# Patient Record
Sex: Male | Born: 1990 | Race: Black or African American | Hispanic: No | Marital: Single | Smoking: Current every day smoker
Health system: Southern US, Community
[De-identification: ages and names within clinical notes are randomized; demographics above are authoritative.]

---

## 2019-01-05 ENCOUNTER — Other Ambulatory Visit: Payer: Self-pay

## 2019-01-05 ENCOUNTER — Ambulatory Visit (HOSPITAL_COMMUNITY)
Admission: EM | Admit: 2019-01-05 | Discharge: 2019-01-05 | Disposition: A | Discharge status: Home / Self Care | Payer: BLUE CROSS/BLUE SHIELD | Attending: Internal Medicine | Admitting: Internal Medicine

## 2019-01-05 ENCOUNTER — Encounter (HOSPITAL_COMMUNITY): Payer: Self-pay | Admitting: Emergency Medicine

## 2019-01-05 DIAGNOSIS — J111 Influenza due to unidentified influenza virus with other respiratory manifestations: Secondary | ICD-10-CM

## 2019-01-05 DIAGNOSIS — R69 Illness, unspecified: Secondary | ICD-10-CM

## 2019-01-05 DIAGNOSIS — R0689 Other abnormalities of breathing: Secondary | ICD-10-CM

## 2019-01-05 MED ORDER — ONDANSETRON 4 MG PO TBDP
ORAL_TABLET | ORAL | Status: AC
  Filled 2019-01-05: qty 1

## 2019-01-05 MED ORDER — ONDANSETRON HCL 4 MG PO TABS: 4.0000 mg | ORAL_TABLET | Freq: Four times a day (QID) | ORAL | 0 refills | Status: AC

## 2019-01-05 MED ORDER — OSELTAMIVIR PHOSPHATE 75 MG PO CAPS: 75.0000 mg | ORAL_CAPSULE | Freq: Two times a day (BID) | ORAL | 0 refills | Status: AC

## 2019-01-05 MED ORDER — ONDANSETRON 4 MG PO TBDP
4.0000 mg | ORAL_TABLET | Freq: Once | ORAL | Status: AC
  Administered 2019-01-05: 4 mg via ORAL

## 2019-01-05 MED ORDER — ACETAMINOPHEN 325 MG PO TABS
ORAL_TABLET | ORAL | Status: AC
  Filled 2019-01-05: qty 2

## 2019-01-05 MED ORDER — ACETAMINOPHEN 325 MG PO TABS
650.0000 mg | ORAL_TABLET | Freq: Once | ORAL | Status: AC
  Administered 2019-01-05: 650 mg via ORAL

## 2019-01-05 MED ORDER — IPRATROPIUM-ALBUTEROL 0.5-2.5 (3) MG/3ML IN SOLN
RESPIRATORY_TRACT | Status: AC
  Filled 2019-01-05: qty 3

## 2019-01-05 MED ORDER — IPRATROPIUM-ALBUTEROL 0.5-2.5 (3) MG/3ML IN SOLN
3.0000 mL | Freq: Once | RESPIRATORY_TRACT | Status: AC
  Administered 2019-01-05: 3 mL via RESPIRATORY_TRACT

## 2019-01-05 MED ORDER — BENZONATATE 200 MG PO CAPS: 200.0000 mg | ORAL_CAPSULE | Freq: Three times a day (TID) | ORAL | 0 refills | Status: AC | PRN

## 2019-01-05 NOTE — Discharge Instructions (Signed)
Symptoms today seem most consistent with flu like illness.  Push fluids and rest.  Anticipate gradual improvement in cough, wellbeing, fever, over the next several days.  Cough may take a couple weeks to subside.  Prescriptions for oseltamivir (flu medicine), benzonatate (for cough), ondansetron (for vomiting) were sent to the pharmacy.  Tylenol or ibuprofen otc as needed for fever, discomfort.  Recheck for persistent (>3 more days) fever >100.5, increasing phlegm production/nasal discharge, or if not starting to improve in a few days.

## 2019-01-05 NOTE — ED Provider Notes (Signed)
[facility]    CSN: [id number] Arrival date & time: 01/05/19  1810     History   Chief Complaint Chief Complaint  Patient presents with  . Fever  . Emesis    HPI Herbert Nixon is a 28 y.o. male. He presents today with onset of fever, cough, prostration on 2/5; had emesis (post tussive and non post tussive) yesterday, decreasing today.  Feels terrible.  Little bit of runny/congested nose.  Chest tightness, hurts to cough.  No diarrhea.  Headache    HPI  History reviewed. No pertinent past medical history.  History reviewed. No pertinent surgical history.     Home Medications    Prior to Admission medications   Medication Sig Start Date End Date Taking? Authorizing Provider  benzonatate (TESSALON) 200 MG capsule Take 1 capsule (200 mg total) by mouth 3 (three) times daily as needed for cough. 01/05/19   Isa RankinMurray, Anajulia Leyendecker Wilson, MD  ondansetron (ZOFRAN) 4 MG tablet Take 1 tablet (4 mg total) by mouth every 6 (six) hours. 01/05/19   Isa RankinMurray, Maysin Carstens Wilson, MD  oseltamivir (TAMIFLU) 75 MG capsule Take 1 capsule (75 mg total) by mouth every 12 (twelve) hours. 01/05/19   Isa RankinMurray, Karma Ansley Wilson, MD    Family History History reviewed. No pertinent family history.  Social History Social History   Tobacco Use  . Smoking status: Current Every Day Smoker    Packs/day: 1.00    Years: 5.00    Pack years: 5.00    Types: Cigarettes  . Smokeless tobacco: Never Used  Substance Use Topics  . Alcohol use: Yes  . Drug use: Never     Allergies   Patient has no known allergies.   Review of Systems Review of Systems  All other systems reviewed and are negative.    Physical Exam Triage Vital Signs ED Triage Vitals  Enc Vitals Group     BP 01/05/19 1832 124/78     Pulse Rate 01/05/19 1832 73     Resp 01/05/19 1832 16     Temp 01/05/19 1832 (!) 100.6 F (38.1 C)     Temp Source 01/05/19 1832 Temporal     SpO2 01/05/19 1832 100 %     Weight --      Height --        Pain Score 01/05/19 1830 8     Pain Loc --    Updated Vital Signs BP 124/78 (BP Location: Right Arm)   Pulse 73   Temp (!) 100.6 F (38.1 C) (Temporal)   Resp 16   SpO2 100%  Physical Exam Vitals signs and nursing note reviewed.  Constitutional:      Comments: Alert, nicely groomed Looks ill but not toxic Laying down on exam table but able to sit up for exam  HENT:     Head: Atraumatic.     Comments: B TMs translucent, no erythema Mild nasal congestion bilaterally, mucusy material present Throat red Eyes:     Comments: Conjugate gaze, no eye redness/drainage  Neck:     Musculoskeletal: Neck supple.  Cardiovascular:     Rate and Rhythm: Normal rate and regular rhythm.  Pulmonary:     Effort: No respiratory distress.     Breath sounds: No wheezing or rales.     Comments: Coarse but symmetric breath sounds throughout Abdominal:     General: There is no distension.     Palpations: Abdomen is soft.     Tenderness: There is no abdominal tenderness.  There is no guarding.  Musculoskeletal: Normal range of motion.  Skin:    General: Skin is warm and dry.     Comments: No cyanosis  Neurological:     Mental Status: He is alert and oriented to person, place, and time.      UC Treatments / Results   Procedures Procedures (including critical care time)  Medications Ordered in UC Medications  ondansetron (ZOFRAN-ODT) disintegrating tablet 4 mg (4 mg Oral Given 01/05/19 1840)  acetaminophen (TYLENOL) tablet 650 mg (650 mg Oral Given 01/05/19 1840)  ipratropium-albuterol (DUONEB) 0.5-2.5 (3) MG/3ML nebulizer solution 3 mL (3 mLs Nebulization Given 01/05/19 1852)   Patient did not feel significant change in chest tightness after breathing treatment at urgent care.   Final Clinical Impressions(s) / UC Diagnoses   Final diagnoses:  Influenza-like illness     Discharge Instructions     Symptoms today seem most consistent with flu like illness.  Push fluids and rest.   Anticipate gradual improvement in cough, wellbeing, fever, over the next several days.  Cough may take a couple weeks to subside.  Prescriptions for oseltamivir (flu medicine), benzonatate (for cough), ondansetron (for vomiting) were sent to the pharmacy.  Tylenol or ibuprofen otc as needed for fever, discomfort.  Recheck for persistent (>3 more days) fever >100.5, increasing phlegm production/nasal discharge, or if not starting to improve in a few days.       ED Prescriptions    Medication Sig Dispense Auth. Provider   oseltamivir (TAMIFLU) 75 MG capsule Take 1 capsule (75 mg total) by mouth every 12 (twelve) hours. 10 capsule Isa RankinMurray, Lessie Manigo Wilson, MD   benzonatate (TESSALON) 200 MG capsule Take 1 capsule (200 mg total) by mouth 3 (three) times daily as needed for cough. 30 capsule Isa RankinMurray, Teria Khachatryan Wilson, MD   ondansetron (ZOFRAN) 4 MG tablet Take 1 tablet (4 mg total) by mouth every 6 (six) hours. 12 tablet Isa RankinMurray, Caralee Morea Wilson, MD        Isa RankinMurray, Gracey Tolle Wilson, MD 01/06/19 1011

## 2019-01-05 NOTE — ED Triage Notes (Signed)
The patient presented to the [facility] with a complaint of a fever and emesis x 3 days.

## 2019-01-15 ENCOUNTER — Emergency Department (HOSPITAL_COMMUNITY): Payer: BLUE CROSS/BLUE SHIELD

## 2019-01-15 ENCOUNTER — Emergency Department (HOSPITAL_COMMUNITY)
Admission: EM | Admit: 2019-01-15 | Discharge: 2019-01-15 | Disposition: A | Discharge status: Home / Self Care | Payer: BLUE CROSS/BLUE SHIELD | Attending: Emergency Medicine | Admitting: Emergency Medicine

## 2019-01-15 DIAGNOSIS — Y999 Unspecified external cause status: Secondary | ICD-10-CM | POA: Insufficient documentation

## 2019-01-15 DIAGNOSIS — S32029A Unspecified fracture of second lumbar vertebra, initial encounter for closed fracture: Secondary | ICD-10-CM | POA: Diagnosis not present

## 2019-01-15 DIAGNOSIS — Y9389 Activity, other specified: Secondary | ICD-10-CM | POA: Insufficient documentation

## 2019-01-15 DIAGNOSIS — S299XXA Unspecified injury of thorax, initial encounter: Secondary | ICD-10-CM | POA: Diagnosis present

## 2019-01-15 DIAGNOSIS — S301XXA Contusion of abdominal wall, initial encounter: Secondary | ICD-10-CM | POA: Diagnosis not present

## 2019-01-15 DIAGNOSIS — Y92410 Unspecified street and highway as the place of occurrence of the external cause: Secondary | ICD-10-CM | POA: Diagnosis not present

## 2019-01-15 DIAGNOSIS — S2220XA Unspecified fracture of sternum, initial encounter for closed fracture: Secondary | ICD-10-CM

## 2019-01-15 DIAGNOSIS — S0990XA Unspecified injury of head, initial encounter: Secondary | ICD-10-CM | POA: Diagnosis not present

## 2019-01-15 LAB — BASIC METABOLIC PANEL
Anion gap: 9 (ref 5–15)
BUN: 8 mg/dL (ref 6–20)
CHLORIDE: 109 mmol/L (ref 98–111)
CO2: 25 mmol/L (ref 22–32)
CREATININE: 0.92 mg/dL (ref 0.61–1.24)
Calcium: 9.1 mg/dL (ref 8.9–10.3)
GFR calc Af Amer: >60 mL/min (ref >60)
GFR calc non Af Amer: >60 mL/min (ref >60)
Glucose, Bld: 97 mg/dL (ref 70–99)
Potassium: 3.8 mmol/L (ref 3.5–5.1)
Sodium: 143 mmol/L (ref 135–145)

## 2019-01-15 LAB — CBC WITH DIFFERENTIAL/PLATELET
Abs Immature Granulocytes: 0.15 10*3/uL — ABNORMAL HIGH (ref 0.00–0.07)
Basophils Absolute: 0 10*3/uL (ref 0.0–0.1)
Basophils Relative: 0 %
EOS PCT: 1 %
Eosinophils Absolute: 0.1 10*3/uL (ref 0.0–0.5)
HCT: 40.9 % (ref 39.0–52.0)
Hemoglobin: 13.7 g/dL (ref 13.0–17.0)
Immature Granulocytes: 1 %
Lymphocytes Relative: 54 %
Lymphs Abs: 5.6 10*3/uL — ABNORMAL HIGH (ref 0.7–4.0)
MCH: 32.8 pg (ref 26.0–34.0)
MCHC: 33.5 g/dL (ref 30.0–36.0)
MCV: 97.8 fL (ref 80.0–100.0)
MONO ABS: 0.7 10*3/uL (ref 0.1–1.0)
Monocytes Relative: 6 %
Neutro Abs: 4 10*3/uL (ref 1.7–7.7)
Neutrophils Relative %: 38 %
Platelets: 323 10*3/uL (ref 150–400)
RBC: 4.18 MIL/uL — ABNORMAL LOW (ref 4.22–5.81)
RDW: 11.4 % — ABNORMAL LOW (ref 11.5–15.5)
WBC: 10.6 10*3/uL — ABNORMAL HIGH (ref 4.0–10.5)
nRBC: 0 % (ref 0.0–0.2)

## 2019-01-15 LAB — ETHANOL: Alcohol, Ethyl (B): 259 mg/dL — ABNORMAL HIGH (ref <10)

## 2019-01-15 LAB — CBG MONITORING, ED: Glucose-Capillary: 77 mg/dL (ref 70–99)

## 2019-01-15 MED ORDER — HYDROCODONE-ACETAMINOPHEN 5-325 MG PO TABS: 1.0000 | ORAL_TABLET | Freq: Four times a day (QID) | ORAL | 0 refills | Status: DC | PRN

## 2019-01-15 MED ORDER — KETOROLAC TROMETHAMINE 60 MG/2ML IM SOLN
60.0000 mg | Freq: Once | INTRAMUSCULAR | Status: AC
  Administered 2019-01-15: 60 mg via INTRAMUSCULAR
  Filled 2019-01-15: qty 2

## 2019-01-15 MED ORDER — IOHEXOL 300 MG/ML  SOLN
100.0000 mL | Freq: Once | INTRAMUSCULAR | Status: AC | PRN
  Administered 2019-01-15: 100 mL via INTRAVENOUS

## 2019-01-15 NOTE — Discharge Instructions (Addendum)
Hydrocodone as prescribed as needed for pain.  Follow-up with your primary doctor in the next 1 to 2 weeks, and return to the ER if symptoms significantly worsen or change.

## 2019-01-15 NOTE — ED Notes (Signed)
Family at beside. Family given emotional support. 
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
## 2019-01-15 NOTE — ED Notes (Signed)
[employer] at bedside, specimen collected completed by Phlebotomy
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
## 2019-01-15 NOTE — ED Notes (Signed)
Chaplain was able to speak with sister after pt gave consent to call.

## 2019-01-15 NOTE — ED Notes (Signed)
Pt walked to restroom with assistance , while in restroom pt vomited and and became diaphoretic , pt remained alert and was placed back in bed

## 2019-01-15 NOTE — ED Notes (Signed)
Condom cath placed on pt, pt tolerated well

## 2019-01-15 NOTE — ED Notes (Signed)
Cervical collar removed by MD.

## 2019-01-15 NOTE — ED Notes (Signed)
Pt given an IS and showed how to use ,

## 2019-01-15 NOTE — ED Notes (Signed)
MD at bedside explaining POC to family
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
## 2019-01-15 NOTE — ED Notes (Signed)
Vital signs stable. 
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
## 2019-01-15 NOTE — ED Notes (Signed)
Family updated as to patient's status.

## 2019-01-19 ENCOUNTER — Encounter (HOSPITAL_COMMUNITY): Payer: Self-pay | Admitting: Emergency Medicine

## 2019-01-19 ENCOUNTER — Ambulatory Visit (HOSPITAL_COMMUNITY)
Admission: EM | Admit: 2019-01-19 | Discharge: 2019-01-19 | Disposition: A | Discharge status: Home / Self Care | Payer: BLUE CROSS/BLUE SHIELD | Attending: Internal Medicine | Admitting: Internal Medicine

## 2019-01-19 DIAGNOSIS — S32029D Unspecified fracture of second lumbar vertebra, subsequent encounter for fracture with routine healing: Secondary | ICD-10-CM

## 2019-01-19 DIAGNOSIS — S2220XD Unspecified fracture of sternum, subsequent encounter for fracture with routine healing: Secondary | ICD-10-CM | POA: Diagnosis not present

## 2019-01-19 NOTE — ED Notes (Signed)
Patient able to ambulate independently  
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
## 2019-01-22 ENCOUNTER — Encounter: Payer: Self-pay | Admitting: Medical

## 2019-01-22 ENCOUNTER — Ambulatory Visit: Payer: BLUE CROSS/BLUE SHIELD | Admitting: Medical

## 2019-01-22 VITALS — BP 128/74 | HR 75 | Temp 97.9°F | Resp 16 | Ht 69.0 in | Wt 139.2 lb

## 2019-01-22 DIAGNOSIS — S32029A Unspecified fracture of second lumbar vertebra, initial encounter for closed fracture: Secondary | ICD-10-CM | POA: Insufficient documentation

## 2019-01-22 DIAGNOSIS — M549 Dorsalgia, unspecified: Secondary | ICD-10-CM | POA: Insufficient documentation

## 2019-01-22 DIAGNOSIS — Z789 Other specified health status: Secondary | ICD-10-CM | POA: Insufficient documentation

## 2019-01-22 DIAGNOSIS — R0789 Other chest pain: Secondary | ICD-10-CM | POA: Diagnosis not present

## 2019-01-22 DIAGNOSIS — M79641 Pain in right hand: Secondary | ICD-10-CM | POA: Insufficient documentation

## 2019-01-22 DIAGNOSIS — R079 Chest pain, unspecified: Secondary | ICD-10-CM | POA: Diagnosis not present

## 2019-01-22 DIAGNOSIS — M898X1 Other specified disorders of bone, shoulder: Secondary | ICD-10-CM | POA: Insufficient documentation

## 2019-01-22 LAB — POCT URINALYSIS DIP (PROADVANTAGE DEVICE)
Bilirubin, UA: NEGATIVE
Blood, UA: NEGATIVE
Glucose, UA: NEGATIVE mg/dL
Ketones, POC UA: NEGATIVE mg/dL
Leukocytes, UA: NEGATIVE
Nitrite, UA: NEGATIVE
Protein Ur, POC: NEGATIVE mg/dL
Specific Gravity, Urine: 1.01
Urobilinogen, Ur: NEGATIVE
pH, UA: 6 (ref 5.0–8.0)

## 2019-01-22 MED ORDER — HYDROCODONE-ACETAMINOPHEN 5-325 MG PO TABS: 1.0000 | ORAL_TABLET | Freq: Four times a day (QID) | ORAL | 0 refills | Status: AC | PRN

## 2019-01-22 MED ORDER — NAPROXEN 500 MG PO TABS: 500.0000 mg | ORAL_TABLET | Freq: Two times a day (BID) | ORAL | 0 refills | Status: DC
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
## 2019-01-25 ENCOUNTER — Ambulatory Visit
Admission: RE | Admit: 2019-01-25 | Discharge: 2019-01-25 | Disposition: A | Discharge status: Home / Self Care | Payer: BLUE CROSS/BLUE SHIELD | Source: Ambulatory Visit | Attending: Medical | Admitting: Medical

## 2019-01-25 DIAGNOSIS — M79641 Pain in right hand: Secondary | ICD-10-CM

## 2019-01-31 ENCOUNTER — Encounter: Payer: Self-pay | Admitting: Internal Medicine

## 2019-02-13 ENCOUNTER — Inpatient Hospital Stay: Payer: BLUE CROSS/BLUE SHIELD | Admitting: Family Medicine

## 2019-02-27 ENCOUNTER — Telehealth: Payer: Self-pay | Admitting: Medical
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
## 2019-02-28 NOTE — Telephone Encounter (Signed)
Informed pt .

## 2019-03-17 ENCOUNTER — Ambulatory Visit (HOSPITAL_COMMUNITY)
Admission: EM | Admit: 2019-03-17 | Discharge: 2019-03-17 | Disposition: A | Discharge status: Home / Self Care | Payer: BLUE CROSS/BLUE SHIELD | Attending: Family Medicine | Admitting: Family Medicine

## 2019-03-17 ENCOUNTER — Encounter (HOSPITAL_COMMUNITY): Payer: Self-pay | Admitting: Emergency Medicine

## 2019-03-17 ENCOUNTER — Other Ambulatory Visit: Payer: Self-pay

## 2019-03-17 DIAGNOSIS — B349 Viral infection, unspecified: Secondary | ICD-10-CM
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
## 2019-03-17 NOTE — Discharge Instructions (Addendum)
Please take Tylenol/Acetaminophen for your headache/body ache s and any fever. You may take [phone] mg every 4-6 hours Rest Drink plenty of fluids  If you develop a cough you may try over-the-counter Robitussin, Delsym or Robitussin-DM If you have congestion you may try Mucinex or Sudafed  You may return to work once you are fever free and symptom free for 3 days, but I would like for you to stay home for at least the next 5 days  Please follow-up if developing persistent fevers, shortness of breath, difficulty breathing, worsening cough, persistent symptoms, worsening headache, neck stiffness

## 2019-03-17 NOTE — ED Triage Notes (Signed)
Per pt he started having generalized body ache and a headache  yesterday. Pt said he felt hot then cold. Pt has not taken anything for headache nor body aches. No cogh no congestion.
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
## 2019-09-21 ENCOUNTER — Encounter (HOSPITAL_COMMUNITY): Payer: Self-pay

## 2019-09-21 ENCOUNTER — Other Ambulatory Visit: Payer: Self-pay

## 2019-09-21 ENCOUNTER — Ambulatory Visit (HOSPITAL_COMMUNITY)
Admission: EM | Admit: 2019-09-21 | Discharge: 2019-09-21 | Disposition: A | Discharge status: Home / Self Care | Payer: BC Managed Care – PPO | Attending: Urgent Care | Admitting: Urgent Care

## 2019-09-21 DIAGNOSIS — H60502 Unspecified acute noninfective otitis externa, left ear: Secondary | ICD-10-CM

## 2019-09-21 DIAGNOSIS — H6690 Otitis media, unspecified, unspecified ear: Secondary | ICD-10-CM

## 2019-09-21 DIAGNOSIS — H9202 Otalgia, left ear: Secondary | ICD-10-CM

## 2019-09-21 MED ORDER — POLYMYXIN B-TRIMETHOPRIM 10000-0.1 UNIT/ML-% OP SOLN: 1.0000 [drp] | OPHTHALMIC | 0 refills | Status: AC

## 2019-09-21 MED ORDER — AMOXICILLIN 875 MG PO TABS: 875.0000 mg | ORAL_TABLET | Freq: Two times a day (BID) | ORAL | 0 refills | Status: AC

## 2019-09-21 NOTE — ED Triage Notes (Signed)
Patient presents to Urgent Care with complaints of left ear pain since a week ago. Patient reports the pain was in his right ear and is now in his left.
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
## 2019-12-05 ENCOUNTER — Encounter (HOSPITAL_COMMUNITY): Payer: Self-pay | Admitting: Emergency Medicine

## 2019-12-05 ENCOUNTER — Other Ambulatory Visit: Payer: Self-pay

## 2019-12-05 ENCOUNTER — Ambulatory Visit (HOSPITAL_COMMUNITY)
Admission: EM | Admit: 2019-12-05 | Discharge: 2019-12-05 | Disposition: A | Discharge status: Home / Self Care | Payer: BC Managed Care – PPO | Attending: Family Medicine | Admitting: Family Medicine

## 2019-12-05 DIAGNOSIS — R509 Fever, unspecified: Secondary | ICD-10-CM

## 2019-12-05 DIAGNOSIS — R52 Pain, unspecified: Secondary | ICD-10-CM

## 2019-12-05 MED ORDER — NAPROXEN 500 MG PO TABS: 500.0000 mg | ORAL_TABLET | Freq: Two times a day (BID) | ORAL | 0 refills | Status: AC

## 2019-12-05 NOTE — ED Triage Notes (Signed)
PT reports cough and joint pain for 2 days. He was tested at work. He thinks he will hear from this in 2-3 days.   He needs a note to quarantine.

## 2019-12-06 NOTE — ED Provider Notes (Signed)
[place]   [id number] 12/05/19 Arrival Time: [place] PLAN:  1. Fever, unspecified fever cause   2. Generalized body aches      He has been tested for COVID by his employer; awaiting results. See letter in file for self-isolation instructions. OTC symptom care. No signs of serious illness. Discussed likely viral cause.  Follow-up Information    Tysinger, Camelia Eng, PA-C.   Specialty: Family Medicine Why: As needed. Contact information: [place] [address] [phone]           Reviewed expectations re: course of current medical issues. Questions answered. Outlined signs and symptoms indicating need for more acute intervention. Patient verbalized understanding. After Visit Summary given.   SUBJECTIVE: History from: patient. Herbert Nixon is a 29 y.o. male who reports subjective fever and generalized body aches/joint aches. Abrupt onset approx 2 d ago. Known COVID-19 contact: questions co-workers. Recent travel: none. Denies: runny nose, congestion, cough, sore throat, difficulty breathing and headache. Normal PO intake without n/v/d.  ROS: As per HPI.   OBJECTIVE:  Vitals:   12/05/19 1426  BP: 118/77  Pulse: 67  Resp: 16  Temp: 98.8 F (37.1 C)  TempSrc: Oral  SpO2: 99%    General appearance: alert; no distress Eyes: PERRLA; EOMI; conjunctiva normal HENT: [place]; AT; nasal mucosa normal; oral mucosa normal Neck: supple  Lungs: speaks full sentences without difficulty; unlabored Extremities: no edema Skin: warm and dry Neurologic: normal gait Psychological: alert and cooperative; normal mood and affect    No Known Allergies   Social History   Socioeconomic History  . Marital status: Single    Spouse name: Not on file  . Number of children: Not on file  . Years of education: Not on file  . Highest education level: Not on file  Occupational History  . Not on file  Tobacco Use  . Smoking status:  Current Every Day Smoker    Packs/day: 0.50    Years: 5.00    Pack years: 2.50    Types: Cigarettes  . Smokeless tobacco: Never Used  Substance and Sexual Activity  . Alcohol use: Yes    Comment: occ  . Drug use: Never  . Sexual activity: Not on file  Other Topics Concern  . Not on file  Social History Narrative  . Not on file   Social Determinants of Health   Financial Resource Strain:   . Difficulty of Paying Living Expenses: Not on file  Food Insecurity:   . Worried About Charity fundraiser in the Last Year: Not on file  . Ran Out of Food in the Last Year: Not on file  Transportation Needs:   . Lack of Transportation (Medical): Not on file  . Lack of Transportation (Non-Medical): Not on file  Physical Activity:   . Days of Exercise per Week: Not on file  . Minutes of Exercise per Session: Not on file  Stress:   . Feeling of Stress : Not on file  Social Connections:   . Frequency of Communication with Friends and Family: Not on file  . Frequency of Social Gatherings with Friends and Family: Not on file  . Attends Religious Services: Not on file  . Active Member of Clubs or Organizations: Not on file  . Attends Archivist Meetings: Not on file  . Marital Status: Not on file  Intimate Partner Violence:   . Fear of Current or Ex-Partner: Not on file  . Emotionally  Abused: Not on file  . Physically Abused: Not on file  . Sexually Abused: Not on file   Family History  Problem Relation Age of Onset  . Healthy Mother    History reviewed. No pertinent surgical history.   Mardella Layman, MD 12/06/19 1050

## 2021-04-26 ENCOUNTER — Other Ambulatory Visit: Payer: Self-pay

## 2021-04-26 ENCOUNTER — Encounter (HOSPITAL_COMMUNITY): Payer: Self-pay

## 2021-04-26 ENCOUNTER — Emergency Department (HOSPITAL_COMMUNITY)
Admission: EM | Admit: 2021-04-26 | Discharge: 2021-04-26 | Disposition: A | Discharge status: Home / Self Care | Payer: BC Managed Care – PPO | Attending: Emergency Medicine | Admitting: Emergency Medicine

## 2021-04-26 DIAGNOSIS — S0990XA Unspecified injury of head, initial encounter: Secondary | ICD-10-CM | POA: Diagnosis present

## 2021-04-26 DIAGNOSIS — S0081XA Abrasion of other part of head, initial encounter: Secondary | ICD-10-CM | POA: Diagnosis not present

## 2021-04-26 DIAGNOSIS — T07XXXA Unspecified multiple injuries, initial encounter: Secondary | ICD-10-CM

## 2021-04-26 DIAGNOSIS — S60812A Abrasion of left wrist, initial encounter: Secondary | ICD-10-CM | POA: Diagnosis not present

## 2021-04-26 DIAGNOSIS — F17210 Nicotine dependence, cigarettes, uncomplicated: Secondary | ICD-10-CM | POA: Diagnosis not present

## 2021-04-26 LAB — RAPID HIV SCREEN (HIV 1/2 AB+AG)
HIV 1/2 Antibodies: NONREACTIVE
HIV-1 P24 Antigen - HIV24: NONREACTIVE

## 2021-04-26 MED ORDER — ACETAMINOPHEN 500 MG PO TABS
1000.0000 mg | ORAL_TABLET | Freq: Once | ORAL | Status: AC
  Administered 2021-04-26: 1000 mg via ORAL
  Filled 2021-04-26: qty 2

## 2021-04-26 NOTE — ED Notes (Signed)
Reviewed discharge instructions with patient. Follow-up care and medications reviewed. Patient  verbalized understanding. Patient A&Ox4, VSS, and ambulatory with steady gait upon discharge.  °
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
## 2021-04-27 LAB — HEPATITIS B SURFACE ANTIGEN: Hepatitis B Surface Ag: NONREACTIVE

## 2024-09-16 ENCOUNTER — Emergency Department (HOSPITAL_COMMUNITY)

## 2024-09-16 ENCOUNTER — Emergency Department (HOSPITAL_COMMUNITY)
Admission: EM | Admit: 2024-09-16 | Discharge: 2024-09-16 | Disposition: A | Discharge status: Home / Self Care | Attending: Emergency Medicine | Admitting: Emergency Medicine

## 2024-09-16 ENCOUNTER — Other Ambulatory Visit: Payer: Self-pay

## 2024-09-16 DIAGNOSIS — R519 Headache, unspecified: Secondary | ICD-10-CM | POA: Diagnosis present

## 2024-09-16 DIAGNOSIS — H5702 Anisocoria: Secondary | ICD-10-CM | POA: Diagnosis not present

## 2024-09-16 MED ORDER — ACETAMINOPHEN 500 MG PO TABS
1000.0000 mg | ORAL_TABLET | Freq: Once | ORAL | Status: AC
  Administered 2024-09-16: 1000 mg via ORAL
  Filled 2024-09-16: qty 2

## 2024-09-16 MED ORDER — IBUPROFEN 400 MG PO TABS
400.0000 mg | ORAL_TABLET | Freq: Once | ORAL | Status: AC
  Administered 2024-09-16: 400 mg via ORAL
  Filled 2024-09-16: qty 1

## 2024-09-16 MED ORDER — METOCLOPRAMIDE HCL 10 MG PO TABS
10.0000 mg | ORAL_TABLET | Freq: Once | ORAL | Status: AC
  Administered 2024-09-16: 10 mg via ORAL
  Filled 2024-09-16: qty 1

## 2024-09-16 NOTE — ED Provider Triage Note (Signed)
 Emergency Medicine Provider Triage Evaluation Note  Herbert Nixon , a 33 y.o. male  was evaluated in triage.  Pt complains of headache since Friday. Denies head trauma, LOC, seizure, blood thinners. Tylenol  not helping. Did have fever Friday which has since resolved. Headache in front/left area.  Denies fever, chest pain, dyspnea, cough, nausea, vomiting, diarrhea. Denies rhinorrhea, congestion, sore throat.   Review of Systems  Positive:  Negative:   Physical Exam  BP (!) 141/96 (BP Location: Right Arm)   Pulse 70   Temp 98.4 F (36.9 C) (Oral)   Resp 16   SpO2 99%  Gen:   Awake, no distress   Resp:  Normal effort  MSK:   Moves extremities without difficulty  Other:    Medical Decision Making  Medically screening exam initiated at 7:25 PM.  Appropriate orders placed.  Herbert Nixon was informed that the remainder of the evaluation will be completed by another provider, this initial triage assessment does not replace that evaluation, and the importance of remaining in the ED until their evaluation is complete.     Herbert Nixon, [place] 09/16/24 1926

## 2024-09-16 NOTE — ED Triage Notes (Signed)
 Headache since Friday without relief from Tylenol . Pt not complaining of vision changes or dizziness.

## 2024-09-16 NOTE — ED Provider Notes (Signed)
 [place] EMERGENCY DEPARTMENT AT [facility] Provider Note   CSN: [id number] Arrival date & time: 09/16/24  8092     Patient presents with: No chief complaint on file.   Herbert Nixon is a 33 y.o. male presents today for headache since Friday.  No relief from Tylenol .  Patient denies numbness, tingling, diplopia, blurred vision, dizziness, tinnitus, neck stiffness, fever, saddle anesthesia, loss of bowel or bladder control, or any other complaints at this time.  Patient notes that he has had unequal pupils since an accident many years ago.   HPI     Prior to Admission medications   Medication Sig Start Date End Date Taking? Authorizing Provider  amoxicillin  (AMOXIL ) 875 MG tablet Take 1 tablet (875 mg total) by mouth 2 (two) times daily. 09/21/19   Christopher Savannah, PA-C  benzonatate  (TESSALON ) 200 MG capsule Take 1 capsule (200 mg total) by mouth 3 (three) times daily as needed for cough. 01/05/19   Jason Leita Blush, MD  HYDROcodone -acetaminophen  (NORCO) 5-325 MG tablet Take 1-2 tablets by mouth every 6 (six) hours as needed. 01/22/19   Tysinger, Alm RAMAN, PA-C  naproxen  (NAPROSYN ) 500 MG tablet Take 1 tablet (500 mg total) by mouth 2 (two) times daily with a meal. 12/05/19   Rolinda Rogue, MD  ondansetron  (ZOFRAN ) 4 MG tablet Take 1 tablet (4 mg total) by mouth every 6 (six) hours. Patient not taking: Reported on 01/22/2019 01/05/19   Jason Leita Blush, MD  oseltamivir  (TAMIFLU ) 75 MG capsule Take 1 capsule (75 mg total) by mouth every 12 (twelve) hours. Patient not taking: Reported on 01/22/2019 01/05/19   Jason Leita Blush, MD  trimethoprim -polymyxin b  (POLYTRIM ) ophthalmic solution Place 1 drop into the left ear every 4 (four) hours. 09/21/19   Christopher Savannah, PA-C    Allergies: Patient has no known allergies.    Review of Systems  Neurological:  Positive for headaches.    Updated Vital Signs BP (!) 135/95   Pulse (!) 56   Temp 98.4 F (36.9 C) (Oral)   Resp 18    SpO2 98%   Physical Exam Vitals and nursing note reviewed.  Constitutional:      General: He is not in acute distress.    Appearance: He is well-developed.  HENT:     Head: Normocephalic and atraumatic.  Eyes:     Extraocular Movements:     Right eye: Normal extraocular motion and no nystagmus.     Left eye: Normal extraocular motion and no nystagmus.     Conjunctiva/sclera: Conjunctivae normal.     Pupils: Pupils are unequal.     Comments: Left pupil 4 mm, right pupil 2 mm  Cardiovascular:     Rate and Rhythm: Normal rate and regular rhythm.     Pulses: Normal pulses.     Heart sounds: Normal heart sounds. No murmur heard. Pulmonary:     Effort: Pulmonary effort is normal. No respiratory distress.     Breath sounds: Normal breath sounds.  Abdominal:     Palpations: Abdomen is soft.     Tenderness: There is no abdominal tenderness.  Musculoskeletal:        General: No swelling.     Cervical back: Neck supple.     Comments: Fingertip clubbing bilaterally  Skin:    General: Skin is warm and dry.     Capillary Refill: Capillary refill takes less than 2 seconds.  Neurological:     Mental Status: He is alert.  Psychiatric:  Mood and Affect: Mood normal.     (all labs ordered are listed, but only abnormal results are displayed) Labs Reviewed - No data to display  EKG: None  Radiology: CT Head Wo Contrast Result Date: 09/16/2024 EXAM: CT HEAD WITHOUT CONTRAST 09/16/2024 08:48:00 PM TECHNIQUE: CT of the head was performed without the administration of intravenous contrast. Automated exposure control, iterative reconstruction, and/or weight based adjustment of the mA/kV was utilized to reduce the radiation dose to as low as reasonably achievable. COMPARISON: CT head 01/15/2019. CLINICAL HISTORY: Headache, increasing frequency or severity. FINDINGS: BRAIN AND VENTRICLES: No acute hemorrhage. No evidence of acute infarct. No hydrocephalus. No extra-axial collection. No  mass effect or midline shift. ORBITS: No acute abnormality. SINUSES: Old healed left lamina papyracea fracture. SOFT TISSUES AND SKULL: No acute soft tissue abnormality. No skull fracture. IMPRESSION: 1. No acute intracranial abnormality. Electronically signed by: Morgane Naveau MD 09/16/2024 09:00 PM EDT RP Workstation: [id number]     Procedures   Medications Ordered in the ED  metoCLOPramide (REGLAN) tablet 10 mg (10 mg Oral Given 09/16/24 1948)  ibuprofen (ADVIL) tablet 400 mg (400 mg Oral Given 09/16/24 1948)  acetaminophen  (TYLENOL ) tablet 1,000 mg (1,000 mg Oral Given 09/16/24 1947)    Clinical Course as of 09/16/24 2314  Sun Sep 16, 2024  2308 Stable PA Patient Known anisocoria.  Headache completely resolved per PA. Asymptomatic after 4 hours of observation. Noted clubbing of fingertips on exam today and patient referred to PCP for further vascular workup. [CC]    Clinical Course User Index [CC] Jerral Meth, MD                                 Medical Decision Making  This patient presents to the ED for concern of HA, this involves an extensive number of treatment options, and is a complaint that carries with it a high risk of complications and morbidity.  The differential diagnosis includes brain bleed, Bell's palsy, headache   Additional history obtained:  Additional history obtained from EMR External records from outside source obtained and reviewed including family medicine office visit   Lab Tests:  I Ordered, and personally interpreted labs.  The pertinent results include:    Imaging Studies ordered:  I ordered imaging studies including CT head Noncon I independently visualized and interpreted imaging which showed No acute intracranial abnormality I agree with the radiologist interpretation   Problem List / ED Course / Critical interventions / Medication management  I ordered medication including Tylenol , Advil, Reglan Reevaluation of the patient  after these medicines showed that the patient Improved I have reviewed the patients home medicines and have made adjustments as needed   Test / Admission - Considered:  Considered for admission or further workup however patient's vital signs, physical exam, and imaging have been reassuring.  Patient states that his symptoms have resolved since receiving medication in triage.  Patient advised to follow-up with [facility] for routine health maintenance.  Patient given return precautions.  I feel patient is safe for discharge at this time.     Final diagnoses:  Nonintractable headache, unspecified chronicity pattern, unspecified headache type  Anisocoria    ED Discharge Orders     None          Francis Ileana SAILOR, PA-C 09/16/24 2314    Jerral Meth, MD 09/17/24 [phone]

## 2024-09-16 NOTE — ED Provider Triage Note (Signed)
 Emergency Medicine Provider Triage Evaluation Note  Herbert Nixon , a 33 y.o. male  was evaluated in triage.  Pt complains of headache for the last 3 days that is gradually worsened with photophobia.  He does report having headaches in the past but usually go away with Tylenol .  No fever or congestion.  He denies any eye pain or change in vision.  No neck pain.  Review of Systems  Positive: Headache Negative: Numbness, weakness, difficulty walking, vomiting, vision change  Physical Exam  BP (!) 141/96 (BP Location: Right Arm)   Pulse 70   Temp 98.4 F (36.9 C) (Oral)   Resp 16   SpO2 99%  Gen:   Awake, no distress   Resp:  Normal effort  MSK:   Moves extremities without difficulty, full range of motion of the neck with no neck pain.  Normal strength and sensation in all 4 extremities Other:  Pupils are unequal.  The left pupil is approximately 4 mm without reactivity and no consensual reaction.  The right pupil is approximately 3 mm and reactive  Medical Decision Making  Medically screening exam initiated at 7:24 PM.  Appropriate orders placed.  Herbert Nixon was informed that the remainder of the evaluation will be completed by another provider, this initial triage assessment does not replace that evaluation, and the importance of remaining in the ED until their evaluation is complete.     Doretha Folks, MD 09/16/24 [phone]

## 2024-09-16 NOTE — Discharge Instructions (Signed)
 Today you were seen for a headache.  Your workup while in the emergency department was reassuring.  You may alternate Tylenol  Motrin if your pain returns.  Please follow-up with community health and wellness for routine healthcare maintenance.  Please return to the ED if you have double vision, dizziness, or uncontrollable vomiting.  Thank you for letting us  treat you today. After reviewing your imaging, I feel you are safe to go home. Please follow up with your PCP in the next several days and provide them with your records from this visit. Return to the Emergency Room if pain becomes severe or symptoms worsen.

## 2024-12-21 ENCOUNTER — Emergency Department (HOSPITAL_COMMUNITY)
Admission: EM | Admit: 2024-12-21 | Discharge: 2024-12-22 | Disposition: A | Attending: Student in an Organized Health Care Education/Training Program | Admitting: Student in an Organized Health Care Education/Training Program

## 2024-12-21 ENCOUNTER — Other Ambulatory Visit: Payer: Self-pay

## 2024-12-21 ENCOUNTER — Encounter (HOSPITAL_COMMUNITY): Payer: Self-pay

## 2024-12-21 DIAGNOSIS — R519 Headache, unspecified: Secondary | ICD-10-CM | POA: Insufficient documentation

## 2024-12-21 NOTE — ED Triage Notes (Addendum)
 Swahili interpreter [phone] Pt reports headache on left side of head starting this morning. Denies injury, nausea or vomiting.  Pt states he hasn't taken any medications at home because he went to the pharmacy & they told him he needed a prescription from a doctor.

## 2024-12-21 NOTE — ED Triage Notes (Signed)
 Quick triage note: Pt c/o HA that started this morning, denies vision changes.

## 2024-12-22 MED ORDER — IBUPROFEN 400 MG PO TABS
600.0000 mg | ORAL_TABLET | Freq: Once | ORAL | Status: AC
  Administered 2024-12-22: 600 mg via ORAL
  Filled 2024-12-22: qty 1

## 2024-12-22 MED ORDER — METOCLOPRAMIDE HCL 10 MG PO TABS
10.0000 mg | ORAL_TABLET | Freq: Once | ORAL | Status: AC
  Administered 2024-12-22: 10 mg via ORAL
  Filled 2024-12-22: qty 1

## 2024-12-22 MED ORDER — METOCLOPRAMIDE HCL 5 MG/ML IJ SOLN
10.0000 mg | Freq: Once | INTRAMUSCULAR | Status: DC
  Filled 2024-12-22: qty 2

## 2024-12-22 MED ORDER — ACETAMINOPHEN 500 MG PO TABS
1000.0000 mg | ORAL_TABLET | Freq: Once | ORAL | Status: AC
  Administered 2024-12-22: 1000 mg via ORAL
  Filled 2024-12-22: qty 2

## 2024-12-22 NOTE — ED Provider Notes (Signed)
 " [name] Provider Note   CSN: [id number] Arrival date & time: 12/21/24  2039     Patient presents with: Headache   Herbert Nixon is a 34 y.o. male.  Patient with past medical history significant for closed fracture of the second lumbar vertebrae presents to the Emergency Department complaining of left-sided headache which began this morning.  He denies nausea, vomiting, injury.  At the time of my assessment he states the headache has subsided substantially and is now very minimal.  He has taken no medications prior to my assessment.  Headache was not sudden onset or worst of patient's life.  No red flag symptoms.    Headache      Prior to Admission medications  Medication Sig Start Date End Date Taking? Authorizing Provider  amoxicillin  (AMOXIL ) 875 MG tablet Take 1 tablet (875 mg total) by mouth 2 (two) times daily. 09/21/19   Christopher Savannah, PA-C  benzonatate  (TESSALON ) 200 MG capsule Take 1 capsule (200 mg total) by mouth 3 (three) times daily as needed for cough. 01/05/19   Jason Leita Blush, MD  HYDROcodone -acetaminophen  (NORCO) 5-325 MG tablet Take 1-2 tablets by mouth every 6 (six) hours as needed. 01/22/19   Tysinger, Alm RAMAN, PA-C  naproxen  (NAPROSYN ) 500 MG tablet Take 1 tablet (500 mg total) by mouth 2 (two) times daily with a meal. 12/05/19   Rolinda Rogue, MD  ondansetron  (ZOFRAN ) 4 MG tablet Take 1 tablet (4 mg total) by mouth every 6 (six) hours. Patient not taking: Reported on 01/22/2019 01/05/19   Jason Leita Blush, MD  oseltamivir  (TAMIFLU ) 75 MG capsule Take 1 capsule (75 mg total) by mouth every 12 (twelve) hours. Patient not taking: Reported on 01/22/2019 01/05/19   Jason Leita Blush, MD  trimethoprim -polymyxin b  (POLYTRIM ) ophthalmic solution Place 1 drop into the left ear every 4 (four) hours. 09/21/19   Christopher Savannah, PA-C    Allergies: Patient has no known allergies.    Review of Systems  Neurological:   Positive for headaches.    Updated Vital Signs BP (!) 140/78   Pulse 81   Temp 98 F (36.7 C)   Resp 17   Ht 5' 9 (1.753 m)   Wt 65 kg   SpO2 98%   BMI 21.16 kg/m   Physical Exam Vitals and nursing note reviewed.  Constitutional:      General: He is not in acute distress.    Appearance: He is well-developed.  HENT:     Head: Normocephalic and atraumatic.  Eyes:     Conjunctiva/sclera: Conjunctivae normal.  Cardiovascular:     Rate and Rhythm: Normal rate and regular rhythm.     Heart sounds: No murmur heard. Pulmonary:     Effort: Pulmonary effort is normal. No respiratory distress.     Breath sounds: Normal breath sounds.  Abdominal:     Palpations: Abdomen is soft.     Tenderness: There is no abdominal tenderness.  Musculoskeletal:        General: No swelling.     Cervical back: Neck supple.  Skin:    General: Skin is warm and dry.     Capillary Refill: Capillary refill takes less than 2 seconds.  Neurological:     Mental Status: He is alert.     Comments: No focal deficit  Psychiatric:        Mood and Affect: Mood normal.     (all labs ordered are listed, but only abnormal  results are displayed) Labs Reviewed - No data to display  EKG: None  Radiology: No results found.   Procedures   Medications Ordered in the ED  ibuprofen  (ADVIL ) tablet 600 mg (600 mg Oral Given 12/22/24 0245)  acetaminophen  (TYLENOL ) tablet 1,000 mg (1,000 mg Oral Given 12/22/24 0245)  metoCLOPramide  (REGLAN ) tablet 10 mg (10 mg Oral Given 12/22/24 0245)                                    Medical Decision Making Risk OTC drugs. Prescription drug management.   Patient presents with a chief complaint of headache.  Differential clues tension headache, cluster headache, migraine, others  Patient has no red flag symptoms to require emergent imaging at this time such as sudden onset, worst headache of life, trauma  Patient treated with ibuprofen , Tylenol , Reglan  with near  complete resolution of symptoms.  Patient describes what sounds like a tension headache which had resolved significantly prior to my assessment.  Patient had further relief of symptoms with medication regimen.  At this time I see no indication for further emergent workup or admission.  Patient appears stable for discharge home.  Return precautions provided.     Final diagnoses:  Acute nonintractable headache, unspecified headache type    ED Discharge Orders     None          Herbert Nixon Herbert Nixon 12/22/24 9673  "

## 2024-12-22 NOTE — Discharge Instructions (Signed)
 You were seen this evening for evaluation of a headache.  You may take acetaminophen  and ibuprofen  for continued pain control.  Follow-up with your primary care provider.  Return to the emergency department if you develop any life-threatening symptoms.
